# Patient Record
Sex: Female | Born: 1995 | Race: White | Hispanic: No | Marital: Single | Smoking: Never smoker
Health system: Southern US, Community
[De-identification: ages and names within clinical notes are randomized; demographics above are authoritative.]

## PROBLEM LIST (undated history)

## (undated) DIAGNOSIS — F319 Bipolar disorder, unspecified: Secondary | ICD-10-CM

## (undated) DIAGNOSIS — K219 Gastro-esophageal reflux disease without esophagitis: Secondary | ICD-10-CM

## (undated) DIAGNOSIS — K625 Hemorrhage of anus and rectum: Secondary | ICD-10-CM

## (undated) DIAGNOSIS — F419 Anxiety disorder, unspecified: Secondary | ICD-10-CM

## (undated) DIAGNOSIS — F32A Depression, unspecified: Secondary | ICD-10-CM

## (undated) HISTORY — PX: CARPAL TUNNEL RELEASE: SHX101

## (undated) HISTORY — DX: Depression, unspecified: F32.A

## (undated) HISTORY — PX: TONSILECTOMY, ADENOIDECTOMY, BILATERAL MYRINGOTOMY AND TUBES: SHX2538

## (undated) HISTORY — DX: Anxiety disorder, unspecified: F41.9

---
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
## 2019-07-07 ENCOUNTER — Emergency Department
Admission: EM | Admit: 2019-07-07 | Discharge: 2019-07-07 | Disposition: A | Discharge status: Home / Self Care | Payer: 59 | Attending: Emergency Medicine | Admitting: Emergency Medicine

## 2019-07-07 ENCOUNTER — Other Ambulatory Visit: Payer: Self-pay

## 2019-07-07 DIAGNOSIS — F29 Unspecified psychosis not due to a substance or known physiological condition: Secondary | ICD-10-CM | POA: Diagnosis not present

## 2019-07-07 DIAGNOSIS — Z79899 Other long term (current) drug therapy: Secondary | ICD-10-CM | POA: Diagnosis not present

## 2019-07-07 DIAGNOSIS — F329 Major depressive disorder, single episode, unspecified: Secondary | ICD-10-CM

## 2019-07-07 DIAGNOSIS — R440 Auditory hallucinations: Secondary | ICD-10-CM | POA: Diagnosis present

## 2019-07-07 DIAGNOSIS — F32A Depression, unspecified: Secondary | ICD-10-CM

## 2019-07-07 HISTORY — DX: Bipolar disorder, unspecified: F31.9

## 2019-07-07 NOTE — ED Provider Notes (Signed)
[facility] Emergency Department Provider Note   ____________________________________________    I have reviewed the triage vital signs and the nursing notes.   HISTORY  Chief Complaint Medical Clearance     HPI Mia Hughes is a 24 y.o. female with a history of bipolar disorder presents with complaint of auditory hallucinations.  Patient reports recent medication change which she thinks has led to worsening of her symptoms.  She denies SI or HI.  Does report mild depression.  Her psychiatrist apparently adjusted her medications.  Denies medical complaints.  No self injury or self-harm.   Past Medical History:  Diagnosis Date  . Bipolar 1 disorder (HCC)   . Rectal bleed     There are no problems to display for this patient.   Past Surgical History:  Procedure Laterality Date  . CARPAL TUNNEL RELEASE    . TONSILECTOMY, ADENOIDECTOMY, BILATERAL MYRINGOTOMY AND TUBES      Prior to Admission medications   Medication Sig Start Date End Date Taking? Authorizing Provider  ciprofloxacin (CIPRO) 500 MG tablet Take 1 tablet by mouth 2 (two) times daily. Stopped taking on 06/02/2015 05/26/15   [provider]  citalopram (CELEXA) 10 MG tablet Take 1 tablet by mouth daily. 06/02/15   [provider]  diphenoxylate-atropine (LOMOTIL) 2.5-0.025 MG tablet Take 2 tablets by mouth 4 (four) times daily as needed for diarrhea or loose stools. 06/06/15   Lorre Nick, MD  Esomeprazole Magnesium (NEXIUM 24HR) 20 MG TBEC Take 20 mg by mouth daily.    [provider]  ibuprofen (ADVIL,MOTRIN) 200 MG tablet Take 400 mg by mouth every 6 (six) hours as needed for moderate pain.    [provider]  ondansetron (ZOFRAN ODT) 8 MG disintegrating tablet Take 1 tablet (8 mg total) by mouth every 8 (eight) hours as needed for nausea or vomiting. 06/06/15   Lorre Nick, MD  ondansetron (ZOFRAN) 4 MG tablet Take 1 tablet (4 mg total) by mouth  every 6 (six) hours. Patient not taking: Reported on 06/06/2015 12/22/14   Elpidio Anis, PA-C  propranolol (INDERAL) 10 MG tablet Take 1 tablet by mouth 2 (two) times daily as needed (anxiety).  06/02/15   [provider]     Allergies Patient has no known allergies.  No family history on file.  Social History Social History   Tobacco Use  . Smoking status: Never Smoker  . Smokeless tobacco: Never Used  Substance Use Topics  . Alcohol use: No  . Drug use: Never    Review of Systems  Constitutional: No fever/chills Eyes: No visual changes.  ENT: No sore throat. Cardiovascular: Denies chest pain. Respiratory: Denies shortness of breath. Gastrointestinal: No abdominal pain.   Genitourinary: Negative for dysuria. Musculoskeletal: Negative for back pain. Skin: Negative for rash. Neurological: Negative for headaches or weakness   ____________________________________________   PHYSICAL EXAM:  VITAL SIGNS: ED Triage Vitals  Enc Vitals Group     BP 07/07/19 1322 120/78     Pulse Rate 07/07/19 1322 (!) 109     Resp 07/07/19 1322 16     Temp 07/07/19 1322 98.8 F (37.1 C)     Temp Source 07/07/19 1322 Oral     SpO2 07/07/19 1322 99 %     Weight 07/07/19 1335 79.4 kg (175 lb)     Height 07/07/19 1335 1.524 m (5')     Head Circumference --      Peak Flow --  Pain Score 07/07/19 1335 0     Pain Loc --      Pain Edu? --      Excl. in [place]? --     Constitutional: Alert and oriented.  Eyes: Conjunctivae are normal.  Head: Atraumatic.  Mouth/Throat: Mucous membranes are moist.    Cardiovascular: Normal rate, regular rhythm. Grossly normal heart sounds.  Good peripheral circulation. Respiratory: Normal respiratory effort.  No retractions. Lungs CTAB.  Musculoskeletal: No lower extremity tenderness nor edema.  Warm and well perfused Neurologic:  Normal speech and language. No gross focal neurologic deficits are appreciated.  Skin:  Skin is warm, dry and  intact. No rash noted. Psychiatric: Mood and affect are normal. Speech and behavior are normal.  ____________________________________________   LABS (all labs ordered are listed, but only abnormal results are displayed)  Labs Reviewed - No data to display ____________________________________________  EKG   ____________________________________________  RADIOLOGY   ____________________________________________   PROCEDURES  Procedure(s) performed: No  Procedures   Critical Care performed: No ____________________________________________   INITIAL IMPRESSION / ASSESSMENT AND PLAN / ED COURSE  Pertinent labs & imaging results that were available during my care of the patient were reviewed by me and considered in my medical decision making (see chart for details).  Patient well-appearing and in no acute distress.  Has no physical complaints.  Patient is medically cleared for psychiatric evaluation, no indication for labs at this time  Patient seen by psychiatry team and they recommend discharge.     ____________________________________________   FINAL CLINICAL IMPRESSION(S) / ED DIAGNOSES  Final diagnoses:  Depression, unspecified depression type        Note:  This document was prepared using Dragon voice recognition software and may include unintentional dictation errors.   Lavonia Drafts, MD 07/07/19 [phone]

## 2019-07-07 NOTE — Consult Note (Addendum)
[place] [facility]iatry Consult   Reason for Consult:  Auditory Hallucination  Referring Physician:  EPD Patient Identification: Mia Hughes MRN:  [id number] Principal Diagnosis: <principal problem not specified> Diagnosis:  Active Problems:   * No active hospital problems. *   Total Time spent with patient: 15 minutes  Subjective:   Mia Hughes is a 24 y.o. female was seen and evaluated by nurse practitioner and TTS counselor. Mia Hughes is awake, alert and oriented x3. Patient reported history with depression, anxiety and bipolar disorder.  She is denying suicidal or homicidal ideations.  Reports worsening auditory hallucinations after recent medication change.  She reports her psychiatrist initiated Vraylar to assist with her mood.  She reports mood irritability and tearfulness that is getting worse.  States she feels her mood is uncontrollable due to starting her menses.  Patient reports she is followed by psychiatrist and therapist.  Reports previous inpatient admission was in 2019 at [facility] due to suicidal ideations.  Patient is adamantly denying that she is suicidal during this assessment.  Denies plan or intent.  Patient was offered overnight observation with medication management.  Patient declined states she would like to follow-up with her personal psychiatrist.  Patient provided verbal authorization to follow-up with her girlfriend Hinton Dyer at [phone].  Spoke to patient's girlfriend who denied any safety concerns at this time.  Denies guns or weapons in the home.  Case was staffed with attending psychiatrist Dwyane Dee.  Patient was provided with additional outpatient resources.  Support encouragement reassurance was provided.  HPI: Per admission assessment note: Mia Hughes is a 24 y.o. female with a history of bipolar disorder presents with complaint of auditory hallucinations.  Patient reports recent medication change which she thinks has led to worsening of her symptoms.  She  denies SI or HI.  Does report mild depression.  Her psychiatrist apparently adjusted her medications.  Denies medical complaints.  No self injury or self-harm.  Past Psychiatric History:   Risk to Self:   Risk to Others:   Prior Inpatient Therapy:   Prior Outpatient Therapy:    Past Medical History:  Past Medical History:  Diagnosis Date  . Bipolar 1 disorder ([place])   . Rectal bleed     Past Surgical History:  Procedure Laterality Date  . CARPAL TUNNEL RELEASE    . TONSILECTOMY, ADENOIDECTOMY, BILATERAL MYRINGOTOMY AND TUBES     Family History: No family history on file. Family Psychiatric  History:  Social History:  Social History   Substance and Sexual Activity  Alcohol Use No     Social History   Substance and Sexual Activity  Drug Use Never    Social History   Socioeconomic History  . Marital status: Single    Spouse name: Not on file  . Number of children: Not on file  . Years of education: Not on file  . Highest education level: Not on file  Occupational History  . Not on file  Tobacco Use  . Smoking status: Never Smoker  . Smokeless tobacco: Never Used  Substance and Sexual Activity  . Alcohol use: No  . Drug use: Never  . Sexual activity: Not on file  Other Topics Concern  . Not on file  Social History Narrative  . Not on file   Social Determinants of Health   Financial Resource Strain:   . Difficulty of Paying Living Expenses:   Food Insecurity:   . Worried About Charity fundraiser in the Last Year:   .  Ran Out of Food in the Last Year:   Transportation Needs:   . Freight forwarder (Medical):   Marland Kitchen Lack of Transportation (Non-Medical):   Physical Activity:   . Days of Exercise per Week:   . Minutes of Exercise per Session:   Stress:   . Feeling of Stress :   Social Connections:   . Frequency of Communication with Friends and Family:   . Frequency of Social Gatherings with Friends and Family:   . Attends Religious Services:   .  Active Member of Clubs or Organizations:   . Attends Banker Meetings:   Marland Kitchen Marital Status:    Additional Social History:    Allergies:  No Known Allergies  Labs: No results found for this or any previous visit (from the past 48 hour(s)).  No current facility-administered medications for this encounter.   Current Outpatient Medications  Medication Sig Dispense Refill  . ciprofloxacin (CIPRO) 500 MG tablet Take 1 tablet by mouth 2 (two) times daily. Stopped taking on 06/02/2015    . citalopram (CELEXA) 10 MG tablet Take 1 tablet by mouth daily.    . diphenoxylate-atropine (LOMOTIL) 2.5-0.025 MG tablet Take 2 tablets by mouth 4 (four) times daily as needed for diarrhea or loose stools. 30 tablet 0  . Esomeprazole Magnesium (NEXIUM 24HR) 20 MG TBEC Take 20 mg by mouth daily.    Marland Kitchen ibuprofen (ADVIL,MOTRIN) 200 MG tablet Take 400 mg by mouth every 6 (six) hours as needed for moderate pain.    Marland Kitchen ondansetron (ZOFRAN ODT) 8 MG disintegrating tablet Take 1 tablet (8 mg total) by mouth every 8 (eight) hours as needed for nausea or vomiting. 20 tablet 0  . ondansetron (ZOFRAN) 4 MG tablet Take 1 tablet (4 mg total) by mouth every 6 (six) hours. (Patient not taking: Reported on 06/06/2015) 12 tablet 0  . propranolol (INDERAL) 10 MG tablet Take 1 tablet by mouth 2 (two) times daily as needed (anxiety).       Musculoskeletal: Strength & Muscle Tone: within normal limits Gait & Station: normal Patient leans: N/A  Psychiatric Specialty Exam: Physical Exam  Vitals reviewed. Constitutional: She appears well-developed.  Psychiatric: She has a normal mood and affect. Her behavior is normal.    Review of Systems  Psychiatric/Behavioral: Positive for decreased concentration. Negative for suicidal ideas. The patient is nervous/anxious.   All other systems reviewed and are negative.   Blood pressure 120/78, pulse (!) 109, temperature 98.8 F (37.1 C), temperature source Oral, resp. rate 16,  height 5' (1.524 m), weight 79.4 kg, last menstrual period 05/09/2019, SpO2 99 %.Body mass index is 34.18 kg/m.  General Appearance: Casual  Eye Contact:  Good  Speech:  Clear and Coherent  Volume:  Normal  Mood:  Anxious  Affect:  Congruent  Thought Process:  Coherent  Orientation:  Full (Time, Place, and Person)  Thought Content:  Logical  Suicidal Thoughts:  No  Homicidal Thoughts:  No  Memory:  Immediate;   Fair Recent;   Fair  Judgement:  Fair  Insight:  Fair  Psychomotor Activity:  Normal  Concentration:  Concentration: Fair  Recall:  [employer] of Knowledge:  Fair  Language:  Fair  Akathisia:  No  Handed:  Right  AIMS (if indicated):     Assets:  Communication Skills Desire for Improvement Resilience Social Support  ADL's:  Intact  Cognition:  WNL  Sleep:      NP spoke to [employer], who was made  aware of discharge disposition recommendation -Patient was offered additional outpatient resources  Disposition: No evidence of imminent risk to self or others at present.   Patient does not meet criteria for psychiatric inpatient admission. Supportive therapy provided about ongoing stressors. Refer to IOP. Discussed crisis plan, support from social network, calling 911, coming to the Emergency Department, and calling Suicide Hotline.  Oneta Rack, NP 07/07/2019 3:32 PM

## 2019-07-07 NOTE — ED Provider Notes (Signed)
Patient seen by psychiatry and cleared for discharge   Jene Every, MD 07/07/19 1419

## 2019-07-07 NOTE — ED Notes (Signed)
Pt dressed out Green pants, underwear, pink socks, tie dyed shirt, tie dyed crocs, bra, hair tie, black and white checked jacket, cell phone

## 2019-07-07 NOTE — BH Assessment (Signed)
Assessment Note  Mia Hughes is an 24 y.o. female who presents to the ER due to hearing voices. Patient states the voices have gone on for a short period of time and her current outpatient provider is working with her to be able to manage them.  However, she was concerned and wanted to get "checked out." After talking with her mother, she was advised to come to the ER. Patient reports of having an inpatient hospitalization in the past, due to SI. Patient states, this current ER visit is different then the one in the past, because she doesn't want to die, she wants to make sure she's okay because of the voices. Per her report, the voices are not causing her too much distress and she's able to manage with them.  During the interview, she was calm, cooperative and pleasant. She was able to provide appropriate answers to the questions. Throughout the interview she denied SI/HI and V/H. Without being asked, she was able to provide protective factors as to why she didn't want to harm herself.  Psychiatric Nurse Practitioner Alcario Drought L.) offered to provide a medication to help but she would have to be observed overnight to make sure she didn't' have any side effects. However, patient stated she was okay to discharge and follow up with her current outpatient provider for the medication adjustments.   Diagnosis: Psychosis  Past Medical History:  Past Medical History:  Diagnosis Date  . Bipolar 1 disorder (HCC)   . Rectal bleed     Past Surgical History:  Procedure Laterality Date  . CARPAL TUNNEL RELEASE    . TONSILECTOMY, ADENOIDECTOMY, BILATERAL MYRINGOTOMY AND TUBES      Family History: No family history on file.  Social History:  reports that she has never smoked. She has never used smokeless tobacco. She reports that she does not drink alcohol or use drugs.  Additional Social History:     CIWA: CIWA-Ar BP: 120/78 Pulse Rate: (!) 109 COWS:    Allergies: No Known Allergies  Home  Medications: (Not in a hospital admission)   OB/GYN Status:  Patient's last menstrual period was 05/09/2019.  General Assessment Data Location of Assessment: [facility] ED TTS Assessment: In system Is this a Tele or Face-to-Face Assessment?: Face-to-Face Is this an Initial Assessment or a Re-assessment for this encounter?: Initial Assessment Patient Accompanied by:: N/A Language Other than English: No Living Arrangements: Other (Comment)(Private Home) What gender do you identify as?: Female Marital status: Single Pregnancy Status: No Living Arrangements: Spouse/significant other Can pt return to current living arrangement?: No Admission Status: Involuntary Petitioner: ED Attending Is patient capable of signing voluntary admission?: No Referral Source: Self/Family/Friend Insurance type: Product/process development scientist Exam [facility] Walk-in ONLY) Medical Exam completed: Yes  Crisis Care Plan Living Arrangements: Spouse/significant other Legal Guardian: Other:(Self) Name of Psychiatrist: [facility] Name of Therapist: Private Practice  Education Status Is patient currently in school?: No Is the patient employed, unemployed or receiving disability?: Employed  Risk to self with the past 6 months Suicidal Ideation: No Has patient been a risk to self within the past 6 months prior to admission? : No Suicidal Intent: No Has patient had any suicidal intent within the past 6 months prior to admission? : No Is patient at risk for suicide?: No Suicidal Plan?: No Has patient had any suicidal plan within the past 6 months prior to admission? : No Access to Means: No What has been your use of drugs/alcohol within the last 12 months?:  Reports of none Previous Attempts/Gestures: No Other Self Harm Risks: Reports of none Triggers for Past Attempts: None known Intentional Self Injurious Behavior: None Family Suicide History: No Recent stressful life event(s): Other (Comment) Persecutory  voices/beliefs?: No Depression: Yes Depression Symptoms: Loss of interest in usual pleasures Substance abuse history and/or treatment for substance abuse?: No Suicide prevention information given to non-admitted patients: Not applicable  Risk to Others within the past 6 months Homicidal Ideation: No Does patient have any lifetime risk of violence toward others beyond the six months prior to admission? : No Thoughts of Harm to Others: No Current Homicidal Intent: No Current Homicidal Plan: No Access to Homicidal Means: No Identified Victim: Reports of none History of harm to others?: No Assessment of Violence: None Noted Violent Behavior Description: Reports of none Does patient have access to weapons?: No Criminal Charges Pending?: No Does patient have a court date: No Is patient on probation?: No  Psychosis Hallucinations: Auditory Delusions: None noted  Mental Status Report Appearance/Hygiene: Unremarkable, In scrubs Eye Contact: Good Motor Activity: Freedom of movement, Unremarkable Speech: Logical/coherent, Unremarkable Level of Consciousness: Alert Mood: Pleasant Affect: Appropriate to circumstance, Depressed Anxiety Level: Minimal Thought Processes: Coherent, Relevant Judgement: Unimpaired Orientation: Person, Place, Time, Situation, Appropriate for developmental age Obsessive Compulsive Thoughts/Behaviors: None  Cognitive Functioning Concentration: Normal Memory: Recent Intact, Remote Intact Is patient IDD: No Insight: Fair Impulse Control: Fair Appetite: Fair Have you had any weight changes? : No Change Sleep: No Change Total Hours of Sleep: 8 Vegetative Symptoms: None  ADLScreening [facility] Assessment Services) Patient's cognitive ability adequate to safely complete daily activities?: Yes Patient able to express need for assistance with ADLs?: Yes Independently performs ADLs?: Yes (appropriate for developmental age)  Prior Inpatient Therapy Prior  Inpatient Therapy: No  Prior Outpatient Therapy Prior Outpatient Therapy: Yes Prior Therapy Dates: Current Prior Therapy Facilty/Provider(s): [facility] Psych [facility] and Private Practice for counseling Reason for Treatment: Medication Managment Does patient have an ACCT team?: No Does patient have Intensive In-House Services?  : No Does patient have [facility] services? : No Does patient have P4CC services?: No  ADL Screening (condition at time of admission) Patient's cognitive ability adequate to safely complete daily activities?: Yes Is the patient deaf or have difficulty hearing?: No Does the patient have difficulty seeing, even when wearing glasses/contacts?: No Does the patient have difficulty concentrating, remembering, or making decisions?: No Patient able to express need for assistance with ADLs?: Yes Does the patient have difficulty dressing or bathing?: No Independently performs ADLs?: Yes (appropriate for developmental age) Does the patient have difficulty walking or climbing stairs?: No Weakness of Legs: None Weakness of Arms/Hands: None  Home Assistive Devices/Equipment Home Assistive Devices/Equipment: None  Therapy Consults (therapy consults require a physician order) PT Evaluation Needed: No OT Evalulation Needed: No SLP Evaluation Needed: No Abuse/Neglect Assessment (Assessment to be complete while patient is alone) Abuse/Neglect Assessment Can Be Completed: Yes Physical Abuse: Denies Verbal Abuse: Denies Sexual Abuse: Denies Exploitation of patient/patient's resources: Denies Self-Neglect: Denies Values / Beliefs Cultural Requests During Hospitalization: None Spiritual Requests During Hospitalization: None Consults Spiritual Care Consult Needed: No Transition of Care Team Consult Needed: No Advance Directives (For Healthcare) Does Patient Have a Medical Advance Directive?: No  Child/Adolescent Assessment Running Away Risk: Denies(Patient is an  adult)  Disposition:  Disposition Initial Assessment Completed for this Encounter: Yes  On Site Evaluation by:   Reviewed with Physician:    Gunnar Fusi MS, LCAS, [facility], [place] Therapeutic  Triage Specialist 07/07/2019 6:18 PM

## 2019-07-07 NOTE — ED Notes (Signed)
Pt endorses hearing voices but denies anything telling her to hurt herself. Pt tearful but calm and cooperative.

## 2019-07-07 NOTE — ED Notes (Signed)
Pt given meal tray. Pt up independently to restroom.

## 2019-07-07 NOTE — ED Triage Notes (Signed)
Pt reports bi-polar (hx), depression, auditory hallucination Reports channels turning on tv, voices in head (just noise not talking to her)

## 2019-07-07 NOTE — ED Notes (Signed)
Psych and TTS at bedside. Pt given phone to call girlfriend.

## 2019-12-23 ENCOUNTER — Encounter: Payer: Self-pay | Admitting: Internal Medicine

## 2020-01-02 ENCOUNTER — Other Ambulatory Visit: Payer: Self-pay

## 2020-01-02 ENCOUNTER — Emergency Department
Admission: EM | Admit: 2020-01-02 | Discharge: 2020-01-02 | Disposition: A | Discharge status: Home / Self Care | Payer: 59 | Attending: Emergency Medicine | Admitting: Emergency Medicine

## 2020-01-02 DIAGNOSIS — K2101 Gastro-esophageal reflux disease with esophagitis, with bleeding: Secondary | ICD-10-CM | POA: Diagnosis not present

## 2020-01-02 DIAGNOSIS — R109 Unspecified abdominal pain: Secondary | ICD-10-CM | POA: Diagnosis present

## 2020-01-02 HISTORY — DX: Gastro-esophageal reflux disease without esophagitis: K21.9

## 2020-01-02 LAB — TYPE AND SCREEN
ABO/RH(D): B POS
Antibody Screen: NEGATIVE

## 2020-01-02 LAB — COMPREHENSIVE METABOLIC PANEL
ALT: 15 U/L (ref 0–44)
AST: 20 U/L (ref 15–41)
Albumin: 4 g/dL (ref 3.5–5.0)
Alkaline Phosphatase: 95 U/L (ref 38–126)
Anion gap: 11 (ref 5–15)
BUN: 13 mg/dL (ref 6–20)
CO2: 24 mmol/L (ref 22–32)
Calcium: 9.4 mg/dL (ref 8.9–10.3)
Chloride: 103 mmol/L (ref 98–111)
Creatinine, Ser: 0.69 mg/dL (ref 0.44–1.00)
GFR, Estimated: >60 mL/min (ref >60)
Glucose, Bld: 121 mg/dL — ABNORMAL HIGH (ref 70–99)
Potassium: 4 mmol/L (ref 3.5–5.1)
Sodium: 138 mmol/L (ref 135–145)
Total Bilirubin: 0.4 mg/dL (ref 0.3–1.2)
Total Protein: 7.7 g/dL (ref 6.5–8.1)

## 2020-01-02 LAB — CBC
HCT: 36.9 % (ref 36.0–46.0)
Hemoglobin: 12.1 g/dL (ref 12.0–15.0)
MCH: 27.7 pg (ref 26.0–34.0)
MCHC: 32.8 g/dL (ref 30.0–36.0)
MCV: 84.4 fL (ref 80.0–100.0)
Platelets: 350 10*3/uL (ref 150–400)
RBC: 4.37 MIL/uL (ref 3.87–5.11)
RDW: 13.6 % (ref 11.5–15.5)
WBC: 8.9 10*3/uL (ref 4.0–10.5)
nRBC: 0 % (ref 0.0–0.2)

## 2020-01-02 LAB — POCT PREGNANCY, URINE: Preg Test, Ur: NEGATIVE

## 2020-01-02 MED ORDER — ALUM & MAG HYDROXIDE-SIMETH 200-200-20 MG/5ML PO SUSP
30.0000 mL | Freq: Once | ORAL | Status: AC
  Administered 2020-01-02: 30 mL via ORAL
  Filled 2020-01-02: qty 30

## 2020-01-02 MED ORDER — SUCRALFATE 1 G PO TABS
1.0000 g | ORAL_TABLET | Freq: Four times a day (QID) | ORAL | 0 refills | Status: DC
Start: 2020-01-02 — End: 2020-02-12

## 2020-01-02 MED ORDER — LIDOCAINE VISCOUS HCL 2 % MT SOLN
15.0000 mL | Freq: Once | OROMUCOSAL | Status: AC
  Administered 2020-01-02: 15 mL via ORAL
  Filled 2020-01-02: qty 15

## 2020-01-02 MED ORDER — FAMOTIDINE 20 MG PO TABS
20.0000 mg | ORAL_TABLET | Freq: Every day | ORAL | 1 refills | Status: DC
Start: 2020-01-02 — End: 2020-01-02

## 2020-01-02 MED ORDER — SUCRALFATE 1 G PO TABS
1.0000 g | ORAL_TABLET | Freq: Four times a day (QID) | ORAL | 0 refills | Status: DC
Start: 2020-01-02 — End: 2020-01-02

## 2020-01-02 MED ORDER — FAMOTIDINE 20 MG PO TABS: 20.0000 mg | ORAL_TABLET | Freq: Every day | ORAL | 1 refills | Status: DC

## 2020-01-02 NOTE — ED Provider Notes (Signed)
[facility] Emergency Department Provider Note    ____________________________________________   I have reviewed the triage vital signs and the nursing notes.   HISTORY  Chief Complaint Abdominal Pain   History limited by: Not Limited   HPI Mia Hughes is a 24 y.o. female who presents to the emergency department today because of concern for continued and worsening nausea, vomiting and now seeing some blood in the vomit.  The patient states she has a long history of having issues with acid reflux.  She will vomit multiple times a day.  She has seen her primary care doctor for this who is trying arrange for her to see GI for an endoscopy.  Currently the patient is taking a PPI.  She states she has started noticing some blood.  The patient denies any bloody stool.  She states she does smoke marijuana daily although did stop for a time while she was in rehab without any change in her symptoms. The patient denies any fevers.   Records reviewed. Per medical record review patient [facility] and [facility]. Treated with PPI and zofran at that time.  Past Medical History:  Diagnosis Date  . Bipolar 1 disorder (HCC)   . GERD (gastroesophageal reflux disease)   . Rectal bleed     There are no problems to display for this patient.   Past Surgical History:  Procedure Laterality Date  . CARPAL TUNNEL RELEASE    . TONSILECTOMY, ADENOIDECTOMY, BILATERAL MYRINGOTOMY AND TUBES      Prior to Admission medications   Medication Sig Start Date End Date Taking? Authorizing Provider  ciprofloxacin (CIPRO) 500 MG tablet Take 1 tablet by mouth 2 (two) times daily. Stopped taking on 06/02/2015 05/26/15   [provider]  citalopram (CELEXA) 10 MG tablet Take 1 tablet by mouth daily. 06/02/15   [provider]  diphenoxylate-atropine (LOMOTIL) 2.5-0.025 MG tablet Take 2 tablets by mouth 4 (four) times daily as  needed for diarrhea or loose stools. 06/06/15   Lorre Nick, MD  Esomeprazole Magnesium (NEXIUM 24HR) 20 MG TBEC Take 20 mg by mouth daily.    [provider]  ibuprofen (ADVIL,MOTRIN) 200 MG tablet Take 400 mg by mouth every 6 (six) hours as needed for moderate pain.    [provider]  ondansetron (ZOFRAN ODT) 8 MG disintegrating tablet Take 1 tablet (8 mg total) by mouth every 8 (eight) hours as needed for nausea or vomiting. 06/06/15   Lorre Nick, MD  ondansetron (ZOFRAN) 4 MG tablet Take 1 tablet (4 mg total) by mouth every 6 (six) hours. Patient not taking: Reported on 06/06/2015 12/22/14   Elpidio Anis, PA-C  propranolol (INDERAL) 10 MG tablet Take 1 tablet by mouth 2 (two) times daily as needed (anxiety).  06/02/15   [provider]    Allergies Patient has no known allergies.  No family history on file.  Social History Social History   Tobacco Use  . Smoking status: Never Smoker  . Smokeless tobacco: Never Used  Vaping Use  . Vaping Use: Every day  Substance Use Topics  . Alcohol use: Yes    Comment: occass  . Drug use: Yes    Types: Marijuana    Review of Systems Constitutional: No fever/chills Eyes: No visual changes. ENT: No sore throat. Cardiovascular: Denies chest pain. Respiratory: Denies shortness of breath. Gastrointestinal: Positive for abdominal pain, nausea, vomiting.  Genitourinary: Negative for dysuria. Musculoskeletal:  Negative for back pain. Skin: Negative for rash. Neurological: Negative for headaches, focal weakness or numbness.  ____________________________________________   PHYSICAL EXAM:  VITAL SIGNS: ED Triage Vitals  Enc Vitals Group     BP 01/02/20 0751 133/80     Pulse Rate 01/02/20 0751 90     Resp 01/02/20 0751 16     Temp 01/02/20 0751 98.4 F (36.9 C)     Temp Source 01/02/20 0751 Oral     SpO2 01/02/20 0751 98 %     Weight 01/02/20 0752 185 lb (83.9 kg)     Height 01/02/20 0752 5' (1.524 m)      Head Circumference --      Peak Flow --      Pain Score 01/02/20 0752 8   Constitutional: Alert and oriented.  Eyes: Conjunctivae are normal.  ENT      Head: Normocephalic and atraumatic.      Nose: No congestion/rhinnorhea.      Mouth/Throat: Mucous membranes are moist.      Neck: No stridor. Hematological/Lymphatic/Immunilogical: No cervical lymphadenopathy. Cardiovascular: Normal rate, regular rhythm.  No murmurs, rubs, or gallops.  Respiratory: Normal respiratory effort without tachypnea nor retractions. Breath sounds are clear and equal bilaterally. No wheezes/rales/rhonchi. Gastrointestinal: Soft and non tender. No rebound. No guarding.  Genitourinary: Deferred Musculoskeletal: Normal range of motion in all extremities. No lower extremity edema. Neurologic:  Normal speech and language. No gross focal neurologic deficits are appreciated.  Skin:  Skin is warm, dry and intact. No rash noted. Psychiatric: Mood and affect are normal. Speech and behavior are normal. Patient exhibits appropriate insight and judgment.  ____________________________________________    LABS (pertinent positives/negatives)  POCT urine negative CBC wbc 8.9, hgb 12.1, plt 350 CMP wnl except glu 121 ____________________________________________   EKG  I, Phineas Semen, attending physician, personally viewed and interpreted this EKG  EKG Time: 0753 Rate: 91 Rhythm: normal sinus rhythm Axis: normal Intervals: qtc 445 QRS: narrow ST changes: no st elevation Impression: normal ekg  ____________________________________________    RADIOLOGY  None  ____________________________________________   PROCEDURES  Procedures  ____________________________________________   INITIAL IMPRESSION / ASSESSMENT AND PLAN / ED COURSE  Pertinent labs & imaging results that were available during my care of the patient were reviewed by me and considered in my medical decision making (see chart for  details).   Patient presented to the emergency department today because of concerns for acid reflux.  Patient's blood work without concerning findings.  Do think she probably suffering from Mallory-Weiss tear.  She did feel better after GI cocktail.  Will plan on discharging with prescription for sucralfate.  Also give patient prescription for Pepcid given that she appears not to have gotten a response from PPI.   ____________________________________________   FINAL CLINICAL IMPRESSION(S) / ED DIAGNOSES  Final diagnoses:  Gastroesophageal reflux disease with esophagitis and hemorrhage     Note: This dictation was prepared with Dragon dictation. Any transcriptional errors that result from this process are unintentional     Phineas Semen, MD 01/02/20 1036

## 2020-01-02 NOTE — Discharge Instructions (Addendum)
Please seek medical attention for any high fevers, chest pain, shortness of breath, change in behavior, persistent vomiting, bloody stool or any other new or concerning symptoms.  

## 2020-01-02 NOTE — ED Notes (Signed)
Pt verbalized understanding of d/c instructions at this time and denies any further questions at this time.   E-sign not working

## 2020-01-02 NOTE — ED Notes (Signed)
This RN to bedside at this time. Pt states 6/10 throat pain due to vomiting. Pt states she has been vomiting almost 10 times a day for the last few days. Pt states hx of GERD, but states medication stopped working. Pt states she is waiting for an appointment for endoscopy at this time.   Pt states, "I threw up this morning and there were blood clots. I don't know what was bile, food, or blood, but I know there were some chunky clots in it"  Pt denies further needs at this time.

## 2020-01-02 NOTE — ED Triage Notes (Signed)
Pt states she has a hx of acid reflux with Nausea and sometimes vomiting for the past 5 years and takes meds for it but states lately she is vomiting 6-7 times a day and over the past couple of days is having bloody emesis with clots and severe epigastric pain.

## 2020-02-12 ENCOUNTER — Ambulatory Visit (INDEPENDENT_AMBULATORY_CARE_PROVIDER_SITE_OTHER): Payer: PRIVATE HEALTH INSURANCE | Admitting: Internal Medicine

## 2020-02-12 ENCOUNTER — Encounter: Payer: Self-pay | Admitting: Internal Medicine

## 2020-02-12 VITALS — BP 110/68 | HR 97 | Ht 60.0 in | Wt 185.0 lb

## 2020-02-12 DIAGNOSIS — F1290 Cannabis use, unspecified, uncomplicated: Secondary | ICD-10-CM | POA: Diagnosis not present

## 2020-02-12 DIAGNOSIS — R1310 Dysphagia, unspecified: Secondary | ICD-10-CM | POA: Diagnosis not present

## 2020-02-12 DIAGNOSIS — R1115 Cyclical vomiting syndrome unrelated to migraine: Secondary | ICD-10-CM

## 2020-02-12 DIAGNOSIS — K920 Hematemesis: Secondary | ICD-10-CM

## 2020-02-12 NOTE — Patient Instructions (Signed)
You have been scheduled for an endoscopy. Please follow written instructions given to you at your visit today. ?If you use inhalers (even only as needed), please bring them with you on the day of your procedure. ? ?Due to recent changes in healthcare laws, you may see the results of your imaging and laboratory studies on MyChart before your provider has had a chance to review them.  We understand that in some cases there may be results that are confusing or concerning to you. Not all laboratory results come back in the same time frame and the provider may be waiting for multiple results in order to interpret others.  Please give us 48 hours in order for your provider to thoroughly review all the results before contacting the office for clarification of your results.  ? ?I appreciate the opportunity to care for you. ?Carl Gessner, MD, FACG ?

## 2020-02-12 NOTE — Progress Notes (Signed)
Mia Hughes 24 y.o. [birth year] [id number]  Assessment & Plan:   Encounter Diagnoses  Name Primary?  . Hematemesis with nausea Yes  . Persistent vomiting   . Dysphagia, unspecified type   . Continuous cannabis use    Evaluate with upper GI endoscopy, possible esophageal dilation depending upon findings. The risks and benefits as well as alternatives of endoscopic procedure(s) have been discussed and reviewed. All questions answered. The patient agrees to proceed.   take zegerid daily not intermittently  As far as etiology not entirely clear could be severe reflux disease, could be some sort of dysmotility disorder.  Achalasia would be in the differential. Chronic cannabis use can sometimes lead to nausea and vomiting that is chronic.  She says she took it drug holiday for some time and there was no difference.  Does not seem like a CNS issue.  Always in the differential for persistent nausea and vomiting however.  Psychiatric illness increases the chance of functional disturbance I think.  Potential for lifestyle modification with reducing caffeine in the form of Pepsi, weight loss, and smoking cessation.  Further plans pending the endoscopy.    Subjective:   Chief Complaint: Vomiting heartburn reflux hematemesis, chronic  HPI 24 year old single white woman with bipolar disorder with years of persistent nausea and vomiting problems.  Really since the age of 24 she says.  She moved here from the [place] area [place] not too long ago, living in [place] with her boyfriend/fianc.  Apparently it was suggested she see a gastroenterologist over the years but she has not yet done so.   Emergency department visit [facility] January 02, 2020 reviewed and relevant information below HPI Mia Hughes is a 25 y.o. female who presents to the emergency department today because of concern for continued and worsening nausea, vomiting and now seeing some blood in the vomit.  The  patient states she has a long history of having issues with acid reflux.  She will vomit multiple times a day.  She has seen her primary care doctor for this who is trying arrange for her to see GI for an endoscopy.  Currently the patient is taking a PPI.  She states she has started noticing some blood.  The patient denies any bloody stool.  She states she does smoke marijuana daily although did stop for a time while she was in rehab without any change in her symptoms. The patient denies any fevers.   Lab Results  Component Value Date   WBC 8.9 01/02/2020   HGB 12.1 01/02/2020   HCT 36.9 01/02/2020   MCV 84.4 01/02/2020   PLT 350 01/02/2020   hCG - January 02, 2020:   She describes frequent heartburn despite trying "all of the acid blocking medications", with forceful vomiting 8-9 times a day.  Has also tried sucralfate.  It occurs in a postprandial fashion and without eating.  Sometimes there is streaky hematemesis.  There is associated chest pain and she says there is intermittent solid food dysphagia.  She gets a suprasternal sticking point and some back pain.  Weight has fluctuated there is no persistent trend.Nocturnal symptoms as well.  Currently using over-the-counter Zegerid in the morning 1 day and the evening the next day like before bed and notices it is helping somewhat.  She does smoke 4 to 6 g of marijuana daily, also a cigarette smoker and 4 cans of Pepsi a day.  She says she took a break from marijuana for months and there was  no change in these vomiting symptoms.  GI review of systems is otherwise negative.  She is in the process of changing her psychiatric medications. Allergies  Allergen Reactions  . Sucralfate Nausea Only    Dizziness   Current Meds  Medication Sig  . hydrOXYzine (VISTARIL) 25 MG capsule Take 25 mg by mouth 3 (three) times daily.  Maxwell Caul Bicarbonate (ZEGERID) 20-1100 MG CAPS capsule Take 1 capsule by mouth daily before breakfast.  .  prazosin (MINIPRESS) 1 MG capsule Take 1 mg by mouth at bedtime.   Past Medical History:  Diagnosis Date  . Bipolar 1 disorder (HCC)   . GERD (gastroesophageal reflux disease)    Past Surgical History:  Procedure Laterality Date  . CARPAL TUNNEL RELEASE    . TONSILECTOMY, ADENOIDECTOMY, BILATERAL MYRINGOTOMY AND TUBES     Social History   Social History Narrative   Patient is single without children she is engaged to be married.  Lives with her fianc in [place].  Financially dependent on her mother she reports.   She is a cigarette smoker and she smokes marijuana daily 4 to 6 g a day.   4 caffeinated beverages a day cans of Pepsi.     Unemployed   family history includes Diabetes in her mother; Hypothyroidism in her mother.   Review of Systems As per HPI, she also complains of some depressed mood fatigue dysmenorrhea and muscle pains and cramps and allergy symptoms.  All other review of systems are negative.  Objective:   Physical Exam BP 110/68   Pulse 97   Ht 5' (1.524 m)   Wt 185 lb (83.9 kg)   LMP 02/09/2020 (Approximate)   BMI 36.13 kg/m  NAD and obese Mouth and pharynx clear Lungs cta Co NL s1s2 no rmg abd soft NT, no HSM, mass BS +, no hernia Alert and oriented x 3 appropriate mood/affect

## 2020-02-13 ENCOUNTER — Encounter: Payer: Self-pay | Admitting: Internal Medicine

## 2020-02-14 ENCOUNTER — Ambulatory Visit (AMBULATORY_SURGERY_CENTER): Payer: PRIVATE HEALTH INSURANCE | Admitting: Internal Medicine

## 2020-02-14 ENCOUNTER — Encounter: Payer: Self-pay | Admitting: Internal Medicine

## 2020-02-14 ENCOUNTER — Other Ambulatory Visit: Payer: Self-pay

## 2020-02-14 VITALS — BP 113/67 | HR 78 | Temp 98.3°F | Resp 17 | Ht 60.0 in | Wt 185.0 lb

## 2020-02-14 DIAGNOSIS — K2100 Gastro-esophageal reflux disease with esophagitis, without bleeding: Secondary | ICD-10-CM | POA: Diagnosis not present

## 2020-02-14 DIAGNOSIS — K449 Diaphragmatic hernia without obstruction or gangrene: Secondary | ICD-10-CM

## 2020-02-14 DIAGNOSIS — K209 Esophagitis, unspecified without bleeding: Secondary | ICD-10-CM

## 2020-02-14 DIAGNOSIS — K920 Hematemesis: Secondary | ICD-10-CM

## 2020-02-14 MED ORDER — SODIUM CHLORIDE 0.9 % IV SOLN: 500.0000 mL | Freq: Once | INTRAVENOUS | Status: DC

## 2020-02-14 MED ORDER — ONDANSETRON 4 MG PO TBDP: 4.0000 mg | ORAL_TABLET | Freq: Three times a day (TID) | ORAL | 1 refills | Status: DC | PRN

## 2020-02-14 MED ORDER — OMEPRAZOLE-SODIUM BICARBONATE 40-1100 MG PO CAPS: 1.0000 | ORAL_CAPSULE | Freq: Every day | ORAL | 3 refills | Status: AC

## 2020-02-14 NOTE — Progress Notes (Signed)
Called to room to assist during endoscopic procedure.  Patient ID and intended procedure confirmed with present staff. Received instructions for my participation in the procedure from the performing physician.  

## 2020-02-14 NOTE — Op Note (Signed)
[facility] Patient Name: Mia Hughes Procedure Date: 02/14/2020 8:51 AM MRN: [id number] Endoscopist: Iva Boop , MD Age: 24 Referring MD:  Date of Birth: September 02, 1995 Gender: Female Account #: [id number] Procedure:                Upper GI endoscopy Indications:              Heartburn, Hematemesis, Persistent vomiting Medicines:                Propofol per Anesthesia, Monitored Anesthesia Care Procedure:                Pre-Anesthesia Assessment:                           - Prior to the procedure, a History and Physical                            was performed, and patient medications and                            allergies were reviewed. The patient's tolerance of                            previous anesthesia was also reviewed. The risks                            and benefits of the procedure and the sedation                            options and risks were discussed with the patient.                            All questions were answered, and informed consent                            was obtained. Prior Anticoagulants: The patient has                            taken no previous anticoagulant or antiplatelet                            agents. ASA Grade Assessment: II - A patient with                            mild systemic disease. After reviewing the risks                            and benefits, the patient was deemed in                            satisfactory condition to undergo the procedure.                           After obtaining informed consent, the endoscope was  passed under direct vision. Throughout the                            procedure, the patient's blood pressure, pulse, and                            oxygen saturations were monitored continuously. The                            Endoscope was introduced through the mouth, and                            advanced to the second part of duodenum. The upper                             GI endoscopy was accomplished without difficulty.                            The patient tolerated the procedure well. Scope In: Scope Out: Findings:                 LA Grade B (one or more mucosal breaks greater than                            5 mm, not extending between the tops of two mucosal                            folds) esophagitis was found in the distal                            esophagus. Biopsies were taken with a cold forceps                            for histology. Verification of patient                            identification for the specimen was done. Estimated                            blood loss was minimal.                           The gastroesophageal flap valve was visualized                            endoscopically and classified as Hill Grade IV (no                            fold, wide open lumen, hiatal hernia present).                           A 2 cm hiatal hernia was present.  The exam was otherwise without abnormality.                           The cardia and gastric fundus were normal on                            retroflexion. Complications:            No immediate complications. Estimated Blood Loss:     Estimated blood loss was minimal. Impression:               - LA Grade B reflux esophagitis. Biopsied.                           - Gastroesophageal flap valve classified as Hill                            Grade IV (no fold, wide open lumen, hiatal hernia                            present).                           - 2 cm hiatal hernia.                           - The examination was otherwise normal. Recommendation:           - Patient has a contact number available for                            emergencies. The signs and symptoms of potential                            delayed complications were discussed with the                            patient. Return to normal activities tomorrow.                             Written discharge instructions were provided to the                            patient.                           - GERD diet.                           - Follow an antireflux regimen.                           - Use Zegerid (omeprazole+bicarb) 40 mg tab by                            mouth daily. Ondansetron prn                           -  OFFICE WILL SCHEDULE GASTRIC EMPTYING STUDY - NO                            ONDANSETRON 24 HRS BEFORE                           - OFFICE WILL SET UP JANUARY APPOINTMENT WITH ME Iva Boop, MD 02/14/2020 9:29:04 AM This report has been signed electronically.

## 2020-02-14 NOTE — Progress Notes (Signed)
To PACU, VSS. Report to [email or web address] 

## 2020-02-14 NOTE — Patient Instructions (Addendum)
There was mild to moderate inflammation where the esophagus and stomach me and a small hiatal hernia.  A hiatal hernia means the stomach is slipping into the chest cavity a little bit from the abdomen.  This is something that increases the risk of reflux problems.  I took biopsies of the inflammation which is a standard to do to confirm what the causes.  I have prescribed prescription strength Zegerid to replace the over-the-counter. I have prescribed ondansetron to help nausea and vomiting as well.  My office staff is going to set up a procedure called solid-phase gastric emptying study to check the function of the stomach to try to understand why you are vomiting.  We will also arrange a follow-up for January.    DO NOT TAKE ONDANSETRON STARTING 24 HOURS BEFORE THE GASTRIC EMPTYING TEST  I appreciate the opportunity to care for you. Iva Boop, MD, FACG  YOU HAD AN ENDOSCOPIC PROCEDURE TODAY AT THE [facility]:   Refer to the procedure report that was given to you for any specific questions about what was found during the examination.  If the procedure report does not answer your questions, please call your gastroenterologist to clarify.  If you requested that your care partner not be given the details of your procedure findings, then the procedure report has been included in a sealed envelope for you to review at your convenience later.  YOU SHOULD EXPECT: Some feelings of bloating in the abdomen. Passage of more gas than usual.  Walking can help get rid of the air that was put into your GI tract during the procedure and reduce the bloating.   Please Note:  You might notice some irritation and congestion in your nose or some drainage.  This is from the oxygen used during your procedure.  There is no need for concern and it should clear up in a day or so.  SYMPTOMS TO REPORT IMMEDIATELY:   Following upper endoscopy (EGD)  Vomiting of blood or coffee ground material  New  chest pain or pain under the shoulder blades  Painful or persistently difficult swallowing  New shortness of breath  Fever of 100F or higher  Black, tarry-looking stools  For urgent or emergent issues, a gastroenterologist can be reached at any hour by calling [phone]. Do not use MyChart messaging for urgent concerns.    DIET:  We do recommend a small meal at first, but then you may proceed to your regular diet.  Drink plenty of fluids but you should avoid alcoholic beverages for 24 hours.  ACTIVITY:  You should plan to take it easy for the rest of today and you should NOT DRIVE or use heavy machinery until tomorrow (because of the sedation medicines used during the test).    FOLLOW UP: Our staff will call the number listed on your records Tuesday morning between 7:15 am and 8:15 am to check on you and address any questions or concerns that you may have regarding the information given to you following your procedure. If we do not reach you, we will leave a message.  We will attempt to reach you two times.  During this call, we will ask if you have developed any symptoms of COVID 19. If you develop any symptoms (ie: fever, flu-like symptoms, shortness of breath, cough etc.) before then, please call [phone].  If you test positive for Covid 19 in the 2 weeks post procedure, please call and report this information to [place].  If any biopsies were taken you will be contacted by phone or by letter within the next 1-3 weeks.  Please call us at [phone] if you have not heard about the biopsies in 3 weeks.    SIGNATURES/CONFIDENTIALITY: You and/or your care partner have signed paperwork which will be entered into your electronic medical record.  These signatures attest to the fact that that the information above on your After Visit Summary has been reviewed and is understood.  Full responsibility of the confidentiality of this discharge information lies with you and/or your  care-partner.

## 2020-02-18 ENCOUNTER — Telehealth: Payer: Self-pay

## 2020-02-18 NOTE — Telephone Encounter (Signed)
  Follow up Call-  Call back number 02/14/2020  Post procedure Call Back phone  # [phone]  Permission to leave phone message Yes  Some recent data might be hidden     Patient questions:  Do you have a fever, pain , or abdominal swelling? No. Pain Score  0 *  Have you tolerated food without any problems? Yes.    Have you been able to return to your normal activities? Yes.    Do you have any questions about your discharge instructions: Diet   No. Medications  No. Follow up visit  No.  Do you have questions or concerns about your Care? No.  Actions: * If pain score is 4 or above: No action needed, pain <4.  1. Have you developed a fever since your procedure? no  2.   Have you had an respiratory symptoms (SOB or cough) since your procedure? no  3.   Have you tested positive for COVID 19 since your procedure no  4.   Have you had any family members/close contacts diagnosed with the COVID 19 since your procedure?  no   If yes to any of these questions please route to Laverna Peace, RN and Karlton Lemon, RN

## 2020-02-19 ENCOUNTER — Telehealth: Payer: Self-pay

## 2020-02-19 DIAGNOSIS — R112 Nausea with vomiting, unspecified: Secondary | ICD-10-CM

## 2020-02-19 NOTE — Telephone Encounter (Signed)
Per procedure report on 02/13/21 Patient needs GES.  Patient has been scheduled for 03/10/21 7:30 at [facility] .  She will need to hold her zofran for 24 hours prior.  Patient notified of scan and instructions. Follow up arranged for 04/02/20 8:30

## 2020-03-10 ENCOUNTER — Other Ambulatory Visit: Payer: Self-pay

## 2020-03-10 ENCOUNTER — Encounter (HOSPITAL_COMMUNITY)
Admission: RE | Admit: 2020-03-10 | Discharge: 2020-03-10 | Disposition: A | Discharge status: Home / Self Care | Payer: 59 | Source: Ambulatory Visit | Attending: Internal Medicine | Admitting: Internal Medicine

## 2020-03-10 DIAGNOSIS — R112 Nausea with vomiting, unspecified: Secondary | ICD-10-CM | POA: Insufficient documentation

## 2020-03-10 MED ORDER — TECHNETIUM TC 99M SULFUR COLLOID
1.9500 | Freq: Once | INTRAVENOUS | Status: AC | PRN
  Administered 2020-03-10: 08:00:00 1.95 via INTRAVENOUS

## 2020-04-02 ENCOUNTER — Ambulatory Visit (INDEPENDENT_AMBULATORY_CARE_PROVIDER_SITE_OTHER): Payer: 59 | Admitting: Internal Medicine

## 2020-04-02 ENCOUNTER — Encounter: Payer: Self-pay | Admitting: Internal Medicine

## 2020-04-02 VITALS — BP 94/56 | HR 85 | Ht 61.0 in | Wt 183.0 lb

## 2020-04-02 DIAGNOSIS — E669 Obesity, unspecified: Secondary | ICD-10-CM

## 2020-04-02 DIAGNOSIS — R112 Nausea with vomiting, unspecified: Secondary | ICD-10-CM

## 2020-04-02 DIAGNOSIS — K2100 Gastro-esophageal reflux disease with esophagitis, without bleeding: Secondary | ICD-10-CM | POA: Diagnosis not present

## 2020-04-02 MED ORDER — ONDANSETRON 4 MG PO TBDP: 4.0000 mg | ORAL_TABLET | Freq: Three times a day (TID) | ORAL | 3 refills | Status: AC | PRN

## 2020-04-02 NOTE — Progress Notes (Signed)
Mia Hughes 24 y.o. 04/17/1995 [id number]  Assessment & Plan:   Encounter Diagnoses  Name Primary?  . Non-intractable vomiting with nausea, unspecified vomiting type Yes  . Gastroesophageal reflux disease with esophagitis without hemorrhage   . Obesity, Class I, BMI 30.0-34.9 (see actual BMI)     Probably a combination of medication side effects and GERD, obesity could be driving the GERD issue.  We talked about all of this.   Plan is to add ondansetron 4 mg as needed dissolvable tablet.  Continue current treatment.  Read The Obesity Code by Dr. Wylene Simmer or Fast Feast Repeat by Guadlupe Spanish and implement his suggestions. Investigate and sign up for the dietdoctor website if desired and utilize those resources Handouts on insulin resistance and proper food choices to reduce that and the use of restricted feeding provided to the patient. Had a long-term approach to this and do not expect rapid results but have a 1 to 2-year timeframe to change her eating and to become fat adapted.   Follow-up here in 1 year sooner as needed  Subjective:   Chief Complaint: GERD nausea and vomiting HPI Mia Hughes is here for follow-up of nausea and vomiting problems, she had an EGD with grade B esophagitis and started on omeprazole sodium bicarbonate 40 mg at bedtime and is significantly better.  Still has a fair amount of morning nausea and vomits once a week or so.  I also had her do a solid-phase gastric emptying study over 4 hours and that was normal.  She reports that she had a lot of weight gain when she was hospitalized for mental illness and started on many medications due to hallucinations and psychoses.  She is on a medication that can cause weight gain as well as nausea and vomiting now.  Again she is much better.  She is aware of low-carb and intermittent fasting as she says her mom does that.  She was asking if she would need to stay on reflux medication for ever.   Allergies  Allergen  Reactions  . Sucralfate Nausea Only    Dizziness   Current Meds  Medication Sig  . omeprazole-sodium bicarbonate (ZEGERID) 40-1100 MG capsule Take 1 capsule by mouth daily before breakfast.  . prazosin (MINIPRESS) 1 MG capsule Take 1 mg by mouth at bedtime.  Marland Kitchen VRAYLAR 4.5 MG CAPS Take 1 capsule by mouth daily.  . [DISCONTINUED] hydrOXYzine (VISTARIL) 25 MG capsule Take 25 mg by mouth 3 (three) times daily.  . [DISCONTINUED] ondansetron (ZOFRAN-ODT) 4 MG disintegrating tablet Take 1 tablet (4 mg total) by mouth every 8 (eight) hours as needed for nausea or vomiting.   Past Medical History:  Diagnosis Date  . Anxiety   . Bipolar 1 disorder (HCC)   . Depression   . GERD (gastroesophageal reflux disease)    Past Surgical History:  Procedure Laterality Date  . CARPAL TUNNEL RELEASE    . TONSILECTOMY, ADENOIDECTOMY, BILATERAL MYRINGOTOMY AND TUBES     Social History   Social History Narrative   Patient is single without children she is engaged to be married.  Lives with her fianc Mia Hughes in [place].  Financially dependent on her mother she reports.   She is a cigarette smoker and she smokes marijuana daily 4 to 6 g a day.   4 caffeinated beverages a day cans of Pepsi.     Unemployed   family history includes Diabetes in her mother; Hypothyroidism in her mother.   Review of Systems As  per HPI Objective:   Physical Exam BP (!) 94/56   Pulse 85   Ht 5\' 1"  (1.549 m)   Wt 183 lb (83 kg)   BMI 34.58 kg/m   Total time 20 minutes

## 2020-04-02 NOTE — Patient Instructions (Signed)
  Restricted feeding and intermittent fasting and low carb eating can help you lose weight as we discussed.  A great book is The Obesity Code by Dr. Wylene Simmer and another is Fast Feast Repeat by Guadlupe Spanish - read one of these and do what they say.  Dietcoctor.com is a wonderful website to guide you to do lower carb and restricted feeding time and intermittent fasting.  Remember - have to lower the insulin levels by changing what you eat. And also - take your time to do this - don't be in a rush.  Ondansetron is to treat nausea as needed.  Mirtazapine is the drug I mentioned you could ask psychiatrist about though can cause increased eating and weight gain so probably not right for you unless the nausea problem is severe.  I appreciate the opportunity to care for you. Iva Boop, MD, [facility]

## 2020-07-23 ENCOUNTER — Ambulatory Visit (HOSPITAL_COMMUNITY)
Admission: EM | Admit: 2020-07-23 | Discharge: 2020-07-23 | Disposition: A | Discharge status: Home / Self Care | Payer: 59 | Attending: Nurse Practitioner | Admitting: Nurse Practitioner

## 2020-07-23 DIAGNOSIS — F3162 Bipolar disorder, current episode mixed, moderate: Secondary | ICD-10-CM | POA: Diagnosis not present

## 2020-07-23 MED ORDER — PRAZOSIN HCL 1 MG PO CAPS: 1.0000 mg | ORAL_CAPSULE | Freq: Every day | ORAL | 1 refills | Status: AC

## 2020-07-23 MED ORDER — CARIPRAZINE HCL 3 MG PO CAPS: 3.0000 mg | ORAL_CAPSULE | Freq: Every day | ORAL | 1 refills | Status: AC

## 2020-07-23 NOTE — Discharge Instructions (Signed)

## 2020-07-23 NOTE — ED Provider Notes (Addendum)
[facility] Medical Screening Exam  Patient Name: Mia Hughes MRN: [id number] Date of Evaluation: 07/23/20 Chief Complaint:   Diagnosis:  Final diagnoses:  Bipolar 1 disorder, mixed, moderate (HCC)    History of Present illness: Mia Hughes is a 25 y.o. female with a history of bipolar disorder who presents to [facility] voluntarily with her fiance due to worsening depression and anxiety. Patient is alert and oriented x 4, pleasant and cooperative. Speech is clear and coherent, normal pace. 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 She reports a history of bipolar disorder. She denies suicidal ideations. She denies homicidal ideations. She reports intermittent AVH. She states that she hears noises at her home when no one else is there. She states that she has VH of shadow figures and polar bears. No indication that the patient is responding to internal stimuli. Reports feeling paranoid that people are watching her. She reports that she sometimes feels that she is walking on water and that the floor is dropping beneath her. Patient reports that she uses approximately 8 grams of cannabis daily. She denies use of alcohol and other substances. Discussed that some of her symptoms could be related to her heavy use of cannabis. She verbalizes understanding. She states that she feels the cannabis helps with her anxiety and sleep.  She reports that she is sleeping approximately 4-5 hours per night.   States that she was most recently prescribed vraylar, prazosin, and hydroxyzine. She feels that vraylar and prazosin were helpful. Medications were confirmed on chart review. Patient is requesting to restart medications. She would like a prescription to allow her time to find another psychiatrist.   Patient reports that she had tried 15 different medications including Abilify, celexa, Remeron, and Zoloft. She does not recall the names of other medications  trialed.   Associated Signs/Symptoms: Depression Symptoms:  depressed mood, anhedonia, insomnia, fatigue, difficulty concentrating, anxiety, (Hypo) Manic Symptoms:  Distractibility, Hallucinations, Impulsivity, Labiality of Mood, Anxiety Symptoms:  Excessive Worry, Social Anxiety, Psychotic Symptoms:  Hallucinations: Auditory Visual PTSD Symptoms: Re-experiencing:  Nightmares  Psychiatric Specialty Exam  Presentation  General Appearance:Appropriate for Environment; Well Groomed  Eye Contact:Good  Speech:Clear and Coherent; Normal Rate  Speech Volume:Normal  Handedness:Left   Mood and Affect  Mood:Anxious; Depressed  Affect:Congruent; Depressed   Thought Process  Thought Processes:Coherent  Descriptions of Associations:Intact  Orientation:Full (Time, Place and Person)  Thought Content:WDL    Hallucinations:Auditory; Visual hears noises in the house  Ideas of Reference:None  Suicidal Thoughts:No  Homicidal Thoughts:No   Sensorium  Memory:Immediate Good; Recent Good; Remote Good  Judgment:Fair  Insight:Fair   Executive Functions  Concentration:Fair  Attention Span:Fair  Recall:Good  Fund of Knowledge:Good  Language:Good   Psychomotor Activity  Psychomotor Activity:Normal   Assets  Assets:Communication Skills; Desire for Improvement; Financial Resources/Insurance; Housing; Physical Health; Leisure Time; Social Support   Sleep  Sleep:Fair  Number of hours: No data recorded  No data recorded  Physical Exam: Physical Exam Constitutional:      General: She is not in acute distress.    Appearance: She is not ill-appearing, toxic-appearing or diaphoretic.  HENT:     Head: Normocephalic.     Right Ear: External ear normal.     Left Ear: External ear normal.  Cardiovascular:     Rate and Rhythm: Normal rate.  Pulmonary:     Effort: Pulmonary effort is normal. No respiratory distress.  Musculoskeletal:  General:  Normal range of motion.  Neurological:     Mental Status: She is alert and oriented to person, place, and time.  Psychiatric:        Mood and Affect: Mood is anxious and depressed.        Speech: Speech normal.        Behavior: Behavior normal.        Thought Content: Thought content does not include homicidal or suicidal ideation.    Review of Systems  Constitutional: Negative for chills, diaphoresis, fever, malaise/fatigue and weight loss.  HENT: Negative for congestion.   Respiratory: Negative for cough and shortness of breath.   Cardiovascular: Negative for chest pain and palpitations.  Gastrointestinal: Negative for diarrhea, nausea and vomiting.  Neurological: Negative for dizziness and seizures.  Psychiatric/Behavioral: Positive for depression, hallucinations and substance abuse. Negative for memory loss and suicidal ideas. The patient is nervous/anxious and has insomnia.   All other systems reviewed and are negative.  Blood pressure (!) 119/54, pulse 72, temperature 97.7 F (36.5 C), temperature source Oral, resp. rate 16, SpO2 100 %. There is no height or weight on file to calculate BMI.  Musculoskeletal: Strength & Muscle Tone: within normal limits Gait & Station: normal Patient leans: N/A   Suicide Risk:  Minimal: No identifiable suicidal ideation.  Patients presenting with no risk factors but with morbid ruminations; may be classified as minimal risk based on the severity of the depressive symptoms   [facility] MSE Discharge Disposition for Follow up and Recommendations: Based on my evaluation the patient does not appear to have an emergency medical condition and can be discharged with resources and follow up care in outpatient services for Medication Management and Individual Therapy   TTS provided with outpatient resources.  Provided prescriptions for vraylar 3 mg daily for mood stability and prazosin 1 mg QHS for nightmares/sleep.   Jackelyn Poling, NP 07/23/2020, 12:34  AM

## 2020-07-23 NOTE — BH Assessment (Signed)
TRIAGE: ROUTINE  Mia Hughes is a 25 year old patient who voluntarily came to the [facility]) independently with her fiance. She shares she has been dx with Schizo-Affective Disorder in the past and that, "I feel like my memory re-sets every 5 minutes. I feel like the floor is falling." Pt would like to start back on her medications, which she states she has been off of since September 2021.  Pt denies SI for the last 2 months; she shares she has planned to kill herself in the past but always had "interventions" of some sort, such as a friend stopping by. Pt shares she was inpt at [facility] in November 2018, which she states was not very helpful but the PHP afterwards was. Pt does not have a plan to kill herself at this time.  Pt denies HI, NSSIB (pt has a hx of cutting and scratching but the last incident was December 2020), access to guns/weapons, or engagement with the legal system. Pt shares she has experienced AVH daily for the past year. She shares she smokes 8g of marijuana daily.  Pt gave verbal consent for clinician to speak with her fiance, Allayne Butcher, who shared she has noticed pt's confusion and not understanding what is happening; she shares these symptoms have been occurring for approximately 1 week. She shares pt also experiences paranoia. She shares pt's anxiety symptoms increase at times but that her symptoms decrease when she smokes marijuana. She expresses no safety concerns at this time.

## 2020-07-24 ENCOUNTER — Telehealth (HOSPITAL_COMMUNITY): Payer: Self-pay

## 2020-07-24 NOTE — BH Assessment (Signed)
Care Management - Follow Up [facility] Discharges   Writer attempted to make contact with patient today and was unsuccessful.  Writer was able to leave a HIPPA compliant voice message and will await callback.   

## 2020-12-25 ENCOUNTER — Telehealth: Payer: Self-pay | Admitting: Internal Medicine

## 2020-12-25 NOTE — Telephone Encounter (Signed)
LVM for pt to call back to schedule OV.

## 2021-03-14 ENCOUNTER — Other Ambulatory Visit: Payer: Self-pay

## 2021-03-14 ENCOUNTER — Emergency Department
Admission: EM | Admit: 2021-03-14 | Discharge: 2021-03-14 | Disposition: A | Discharge status: Home / Self Care | Payer: Self-pay | Attending: Emergency Medicine | Admitting: Emergency Medicine

## 2021-03-14 ENCOUNTER — Encounter: Payer: Self-pay | Admitting: Emergency Medicine

## 2021-03-14 ENCOUNTER — Emergency Department: Payer: Self-pay

## 2021-03-14 DIAGNOSIS — R030 Elevated blood-pressure reading, without diagnosis of hypertension: Secondary | ICD-10-CM | POA: Insufficient documentation

## 2021-03-14 DIAGNOSIS — J069 Acute upper respiratory infection, unspecified: Secondary | ICD-10-CM | POA: Insufficient documentation

## 2021-03-14 DIAGNOSIS — Z20822 Contact with and (suspected) exposure to covid-19: Secondary | ICD-10-CM | POA: Insufficient documentation

## 2021-03-14 LAB — RESP PANEL BY RT-PCR (FLU A&B, COVID) ARPGX2
Influenza A by PCR: NEGATIVE
Influenza B by PCR: NEGATIVE
SARS Coronavirus 2 by RT PCR: NEGATIVE

## 2021-03-14 MED ORDER — BENZONATATE 200 MG PO CAPS: 200.0000 mg | ORAL_CAPSULE | Freq: Three times a day (TID) | ORAL | 0 refills | Status: AC | PRN

## 2021-03-14 NOTE — Discharge Instructions (Addendum)
Follow-up with your primary care provider or urgent care if any continued problems.  Your nasal swab was negative for COVID and influenza.  Chest x-ray did not show any acute respiratory disease.  Increase fluid intake to prevent dehydration.  You may continue taking Tylenol or ibuprofen as needed for muscle aches, headache, fever.  Return to the emergency department if any severe worsening of your symptoms.  Also your blood pressure in triage was elevated at 155/106.  You should have your blood pressure rechecked to evaluate for hypertension.  The cough medication that was sent to the pharmacy should not increase your blood pressure or cause drowsiness.

## 2021-03-14 NOTE — ED Provider Notes (Signed)
[facility] Provider Note    Event Date/Time   First MD Initiated Contact with Patient 03/14/21 [phone]     (approximate)   History   Cough, Shortness of Breath, Fever, and Nasal Congestion   HPI  Mia Hughes is a 26 y.o. female presents to the ED with complaint of cough, congestion, low-grade temp and now chest pain especially when she coughs.  Patient states that she has had symptoms for approximately 5 days.  She is unaware of any known sick contacts.  She is vaccinated for COVID.  Patient is non-smoker.  History of anxiety, bipolar, depression, and GERD.  No prior history of hypertension.      Physical Exam   Triage Vital Signs: ED Triage Vitals  Enc Vitals Group     BP 03/14/21 0950 (!) 155/106     Pulse Rate 03/14/21 0950 100     Resp 03/14/21 0950 (!) 22     Temp 03/14/21 0950 98 F (36.7 C)     Temp Source 03/14/21 0950 Oral     SpO2 03/14/21 0950 99 %     Weight 03/14/21 0934 170 lb (77.1 kg)     Height 03/14/21 0934 5' (1.524 m)     Head Circumference --      Peak Flow --      Pain Score 03/14/21 0934 5     Pain Loc --      Pain Edu? --      Excl. in GC? --     Most recent vital signs: Vitals:   03/14/21 0950  BP: (!) 155/106  Pulse: 100  Resp: (!) 22  Temp: 98 F (36.7 C)  SpO2: 99%     General: Awake, no distress.  Sitting on stretcher, oriented, able to answer questions without any shortness of breath. CV:  Good peripheral perfusion.  Heart regular rate and rhythm without murmur. Resp:  Normal effort.  No wheezing or rhonchi noted bilaterally.  Lungs are clear to auscultation bilaterally. Abd:  No distention.  Other:  Mild nasal congestion.  Able move upper and lower extremities without any difficulty.  No rashes noted.   ED Results / Procedures / Treatments   Labs (all labs ordered are listed, but only abnormal results are displayed) Labs Reviewed  RESP PANEL BY RT-PCR (FLU A&B, COVID) [id number]       RADIOLOGY 1 view chest shows thoracic scoliosis but no obvious pneumonia.  Radiology report was reviewed by myself.  No cardiopulmonary disease and noted thoracic scoliosis.  PROCEDURES:  Critical Care performed: No  Procedures   MEDICATIONS ORDERED IN ED: Medications - No data to display   IMPRESSION / MDM / ASSESSMENT AND PLAN / ED COURSE  I reviewed the triage vital signs and the nursing notes.                              Differential diagnosis includes, but is not limited to, viral respiratory illness, COVID, influenza, bronchitis.  26 year old female presents to the ED with complaint of duct, left, nasal congestion and fever for the last 5 days.  Patient also has an elevated blood pressure for which she does not have a history of hypertension.  We discussed having her blood pressure rechecked and reevaluated.  Patient was reassured that nasal swab was negative for influenza and COVID.  No wheezing or respiratory distress was noted during her ED visit and patient was able  to answer questions completely without any shortness of breath.  Tessalon Perles 200 mg 1 tablet 3 times daily as needed for coughing and patient is aware that she should increase fluids to stay hydrated.  Tylenol or ibuprofen as needed for body aches, headache or fever.       FINAL CLINICAL IMPRESSION(S) / ED DIAGNOSES   Final diagnoses:  Viral URI with cough  Elevated blood pressure reading     Rx / DC Orders   ED Discharge Orders          Ordered    benzonatate (TESSALON) 200 MG capsule  3 times daily PRN        03/14/21 1125             Note:  This document was prepared using Dragon voice recognition software and may include unintentional dictation errors.   Tommi Rumps, PA-C 03/14/21 1132    Shaune Pollack, MD 03/14/21 [phone]

## 2021-03-14 NOTE — ED Notes (Signed)
Started having congestion last Tuesday. Went the [place] [facility] Friday and was told she had bronchiolitis. States not getting any better. Did not prescribe her anything.

## 2021-03-14 NOTE — ED Triage Notes (Signed)
Pt reports productive cough, nasal congestion and fever since Tuesday. Pt states phlem is clear but reports pain to her chest when she coughs.

## 2022-09-22 IMAGING — DX DG CHEST 1V PORT
1 series · 1 of 1 positions shown · non-contrast
Comparison: None.

CLINICAL DATA: Cough and fever for 5 days.

EXAM:
PORTABLE CHEST 1 VIEW

[chest ap]
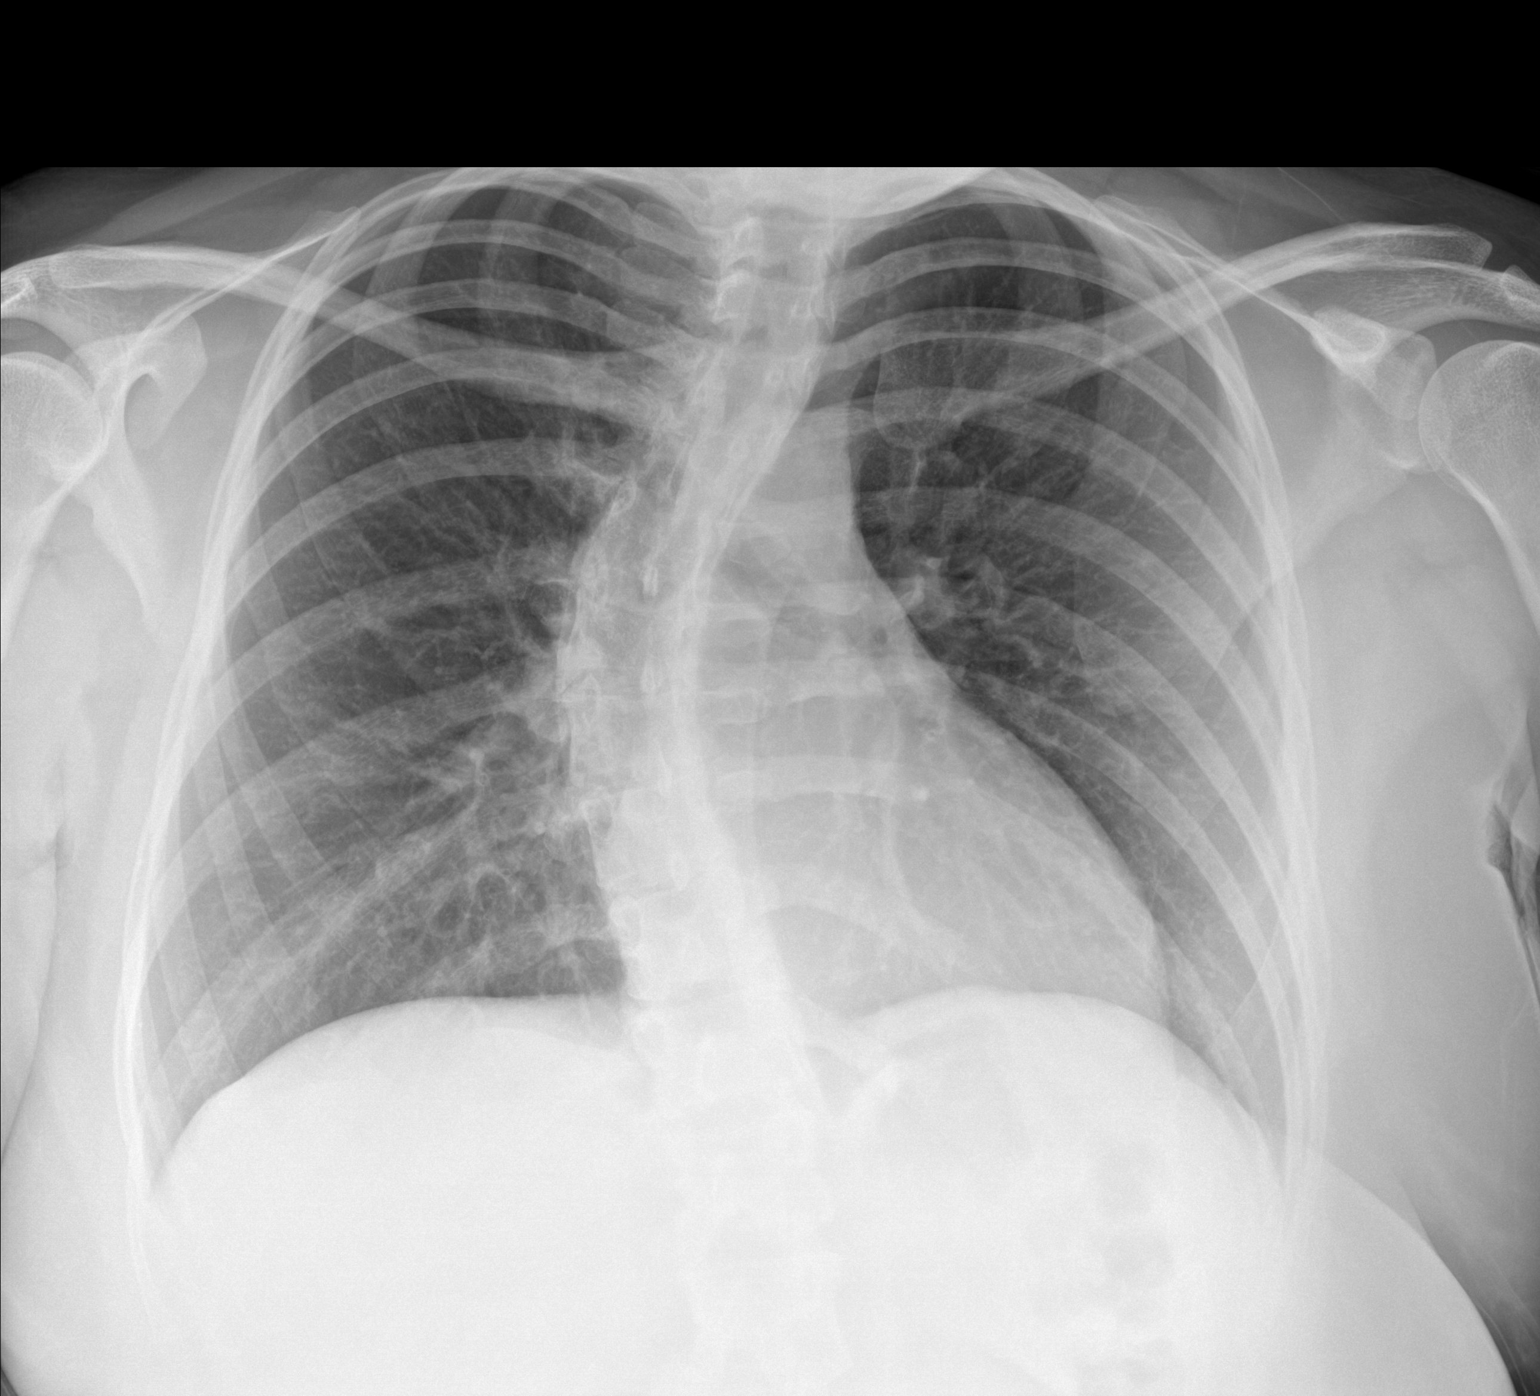

[1 of 1 positions shown; findings below may reference images not displayed]

FINDINGS: The heart size and mediastinal contours are within normal limits.
Both lungs are clear. Thoracic scoliosis is convex towards the
right.
IMPRESSION: No active disease.
